# Patient Record
Sex: Male | Born: 2005 | Race: White | Hispanic: No | Marital: Single | Smoking: Never smoker
Health system: Southern US, Community
[De-identification: ages and names within clinical notes are randomized; demographics above are authoritative.]

## PROBLEM LIST (undated history)

## (undated) DIAGNOSIS — F840 Autistic disorder: Secondary | ICD-10-CM

## (undated) DIAGNOSIS — T7840XA Allergy, unspecified, initial encounter: Secondary | ICD-10-CM

---
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
## 2023-04-03 ENCOUNTER — Telehealth: Payer: Self-pay

## 2023-04-03 NOTE — Transitions of Care (Post Inpatient/ED Visit) (Unsigned)
   04/03/2023  Name: Howard Montes MRN: [id number] DOB: 29-Mar-2006  Today's TOC FU Call Status: Today's TOC FU Call Status:: Unsuccessul Call (1st Attempt) Unsuccessful Call (1st Attempt) Date: 04/03/23  Attempted to reach the patient regarding the most recent Inpatient/ED visit.  Follow Up Plan: Additional outreach attempts will be made to reach the patient to complete the Transitions of Care (Post Inpatient/ED visit) call.   Signature Karena Addison, LPN [facility] Nurse Health Advisor Direct Dial [phone]

## 2023-04-04 NOTE — Transitions of Care (Post Inpatient/ED Visit) (Unsigned)
   04/04/2023  Name: Howard Montes MRN: [id number] DOB: [birth year]  Today's TOC FU Call Status: Today's TOC FU Call Status:: Unsuccessful Call (2nd Attempt) Unsuccessful Call (1st Attempt) Date: 04/03/23 Unsuccessful Call (2nd Attempt) Date: 04/04/23  Attempted to reach the patient regarding the most recent Inpatient/ED visit.  Follow Up Plan: Additional outreach attempts will be made to reach the patient to complete the Transitions of Care (Post Inpatient/ED visit) call.   Signature Karena Addison, LPN [facility] Nurse Health Advisor Direct Dial [phone]

## 2023-04-05 NOTE — Transitions of Care (Post Inpatient/ED Visit) (Signed)
   04/05/2023  Name: Howard Montes MRN: [id number] DOB: September 18, 2006  Today's TOC FU Call Status: Today's TOC FU Call Status:: Unsuccessful Call (3rd Attempt) Unsuccessful Call (1st Attempt) Date: 04/03/23 Unsuccessful Call (2nd Attempt) Date: 04/04/23 Unsuccessful Call (3rd Attempt) Date: 04/05/23  Attempted to reach the patient regarding the most recent Inpatient/ED visit.  Follow Up Plan: No further outreach attempts will be made at this time. We have been unable to contact the patient.  Signature Karena Addison, LPN [facility] Nurse Health Advisor Direct Dial [phone]
